# Patient Record
Sex: Female | Born: 2000 | Race: White | Hispanic: No | Marital: Single | Smoking: Never smoker
Health system: Southern US, Community
[De-identification: ages and names within clinical notes are randomized; demographics above are authoritative.]

---
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
## 2020-01-11 ENCOUNTER — Telehealth: Payer: Self-pay | Admitting: *Deleted

## 2020-01-11 NOTE — Telephone Encounter (Signed)
Patient's dad left a message requesting that her mom be called because she has tested positive for covid.  Called back and spoke to patient and her mom was on the phone also. Patient stated that she went to [facility] yesterday and was tested for covid. Patient stated that she got a message today that her test came back positive. Patient stated that she has a fever, cough, chills and body aches. Offered patient a virtual visit tomorrow with Nicki Reaper NP which she declined. Patient's mom stated that they are treating her symptoms with OTC medications. Patient stated that she will see how she does the next couple of days and if she does not improved she will call back for a virtual visit. ER precautions were given to patient and her mom and they verbalized understanding.

## 2020-01-12 NOTE — Telephone Encounter (Signed)
Agree with advise given.

## 2020-02-01 ENCOUNTER — Ambulatory Visit: Payer: BC Managed Care – PPO | Admitting: Internal Medicine

## 2020-03-29 ENCOUNTER — Ambulatory Visit: Payer: BC Managed Care – PPO | Admitting: Family Medicine

## 2020-03-29 ENCOUNTER — Other Ambulatory Visit: Payer: Self-pay

## 2020-03-29 ENCOUNTER — Encounter: Payer: Self-pay | Admitting: Family Medicine

## 2020-03-29 VITALS — BP 106/68 | HR 107 | Temp 97.8°F | Ht 65.0 in | Wt 116.0 lb

## 2020-03-29 DIAGNOSIS — M79602 Pain in left arm: Secondary | ICD-10-CM | POA: Diagnosis not present

## 2020-03-29 DIAGNOSIS — R946 Abnormal results of thyroid function studies: Secondary | ICD-10-CM | POA: Insufficient documentation

## 2020-03-29 NOTE — Patient Instructions (Signed)
Labs today We will watch these symptoms.  May try tylenol as needed.  Continue multivitamin.

## 2020-03-29 NOTE — Assessment & Plan Note (Addendum)
Update TSH, fT4 Patient largely asxs

## 2020-03-29 NOTE — Progress Notes (Signed)
This visit was conducted in person.  BP 106/68 (BP Location: Left Arm, Patient Position: Sitting, Cuff Size: Normal)   Pulse (!) 107   Temp 97.8 F (36.6 C) (Temporal)   Ht 5\' 5"  (1.651 m)   Wt 116 lb (52.6 kg)   LMP 03/03/2020   SpO2 99%   BMI 19.30 kg/m    CC: L arm pain Subjective:    Patient ID: Rhonda Freeman, female    DOB: Aug 01, 2001, 19 y.o.   MRN: [id number]  HPI: Rhonda Freeman is a 19 y.o. female presenting on 03/29/2020 for Arm Pain (C/o of left arm pain at shot site.  Received J&J COVID shot 03/17/20. )   Received J&J vaccine 03/17/2020 into L arm. Tolerated vaccine well - some body aches and HA day after shot but resolved. Did well the first 2 days.  Since yesterday noticing waves of shooting pain starting from L shoulder down to wrist - described as shooting pain rake going down entire arm (front, back, sides) - this can last from 10 seconds to 30 seconds. Intense discomfort. No aggravating or alleviating factors. Episodes can happen 5-10 times.   No heavy lifting, new exercise routine.  No headaches, fevers/chills, fatigue, back ache, vision changes, congestion, chest pain, dyspnea, abd pain. No neck pain. No paresthesias or numbness or weakness of either arm. No new rash.  Not on medications at this time.      Relevant past medical, surgical, family and social history reviewed and updated as indicated. Interim medical history since our last visit reviewed. Allergies and medications reviewed and updated. Outpatient Medications Prior to Visit  Medication Sig Dispense Refill  . polyethylene glycol (MIRALAX / GLYCOLAX) packet Take 17 g by mouth daily as needed for mild constipation.     No facility-administered medications prior to visit.     Per HPI unless specifically indicated in ROS section below Review of Systems Objective:    BP 106/68 (BP Location: Left Arm, Patient Position: Sitting, Cuff Size: Normal)   Pulse (!) 107   Temp 97.8 F (36.6 C)  (Temporal)   Ht 5\' 5"  (1.651 m)   Wt 116 lb (52.6 kg)   LMP 03/03/2020   SpO2 99%   BMI 19.30 kg/m   Wt Readings from Last 3 Encounters:  03/29/20 116 lb (52.6 kg) (29 %, Z= -0.54)*  01/15/19 117 lb (53.1 kg) (37 %, Z= -0.33)*  01/07/19 118 lb 4 oz (53.6 kg) (40 %, Z= -0.25)*   * Growth percentiles are based on CDC (Girls, 2-20 Years) data.    Physical Exam Vitals and nursing note reviewed.  Constitutional:      Appearance: Normal appearance. She is not ill-appearing.  HENT:     Head: Normocephalic and atraumatic.  Eyes:     General:        Right eye: No discharge.        Left eye: No discharge.     Extraocular Movements: Extraocular movements intact.     Conjunctiva/sclera: Conjunctivae normal.     Pupils: Pupils are equal, round, and reactive to light.  Musculoskeletal:        General: No swelling, tenderness or signs of injury. Normal range of motion.     Cervical back: Normal range of motion and neck supple. No rigidity.     Right lower leg: No edema.     Left lower leg: No edema.     Comments:  FROM at cervical neck without midline or paracervical mm tenderoness  No palpable cords.  Bilateral shoulder exam: No deformity of shoulders on inspection. No pain with palpation of shoulder landmarks. FROM in abduction and forward flexion. No pain or weakness with testing SITS in ext/int rotation. No pain with empty can sign. Neg Yerguson, Speed test. No impingement. No pain with crossover test. No pain with rotation of humeral head in GH joint.   Lymphadenopathy:     Cervical: No cervical adenopathy.  Skin:    General: Skin is warm and dry.     Capillary Refill: Capillary refill takes less than 2 seconds.     Findings: Bruising (small bruise medial mid upper L arm) present. No erythema or rash.  Neurological:     General: No focal deficit present.     Mental Status: She is alert. Mental status is at baseline.     Sensory: Sensation is intact.     Motor: Motor  function is intact.     Coordination: Coordination is intact.     Comments:  CN grossly intact 5/5 strength BUE Grip strength intact BUE sensation intact to light touch  Psychiatric:        Mood and Affect: Mood normal.        Behavior: Behavior normal.       Results for orders placed or performed in visit on 11/11/18  Calprotectin, Fecal   Specimen: Stool   STOOL  Result Value Ref Range   Calprotectin, Fecal 24 0 - 120 ug/g   Assessment & Plan:  This visit occurred during the SARS-CoV-2 public health emergency.  Safety protocols were in place, including screening questions prior to the visit, additional usage of staff PPE, and extensive cleaning of exam room while observing appropriate contact time as indicated for disinfecting solutions.   Problem List Items Addressed This Visit    Left arm pain - Primary    Overall benign exam. Unclear cause of symptoms.  Not consistent with DVT - unable to add D-dimer to blood drawn. If not improving, low threshold to check LUE venous US.  Check CBC to eval for thrombocytopenia today.       Relevant Orders   TSH   T4, free   CBC with Differential/Platelet   Vitamin B12   Abnormal thyroid function test    Update TSH, fT4 Patient largely asxs         No orders of the defined types were placed in this encounter.  Orders Placed This Encounter  Procedures  . TSH  . T4, free  . CBC with Differential/Platelet  . Vitamin B12    Patient Instructions  Labs today We will watch these symptoms.  May try tylenol as needed.  Continue multivitamin.    Follow up plan: No follow-ups on file.  Eustaquio Boyden, MD

## 2020-03-29 NOTE — Assessment & Plan Note (Addendum)
Overall benign exam. Unclear cause of symptoms.  Not consistent with DVT - unable to add D-dimer to blood drawn. If not improving, low threshold to check LUE venous US.  Check CBC to eval for thrombocytopenia today.

## 2020-03-30 ENCOUNTER — Telehealth: Payer: Self-pay | Admitting: Internal Medicine

## 2020-03-30 LAB — CBC WITH DIFFERENTIAL/PLATELET
Basophils Absolute: 0.1 10*3/uL (ref 0.0–0.1)
Basophils Relative: 1.2 % (ref 0.0–3.0)
Eosinophils Absolute: 0.2 10*3/uL (ref 0.0–0.7)
Eosinophils Relative: 1.8 % (ref 0.0–5.0)
HCT: 36.9 % (ref 36.0–49.0)
Hemoglobin: 12.2 g/dL (ref 12.0–16.0)
Lymphocytes Relative: 35.7 % (ref 24.0–48.0)
Lymphs Abs: 3.2 10*3/uL (ref 0.7–4.0)
MCHC: 33.1 g/dL (ref 31.0–37.0)
MCV: 86.4 fl (ref 78.0–98.0)
Monocytes Absolute: 0.8 10*3/uL (ref 0.1–1.0)
Monocytes Relative: 9 % (ref 3.0–12.0)
Neutro Abs: 4.7 10*3/uL (ref 1.4–7.7)
Neutrophils Relative %: 52.3 % (ref 43.0–71.0)
Platelets: 378 10*3/uL (ref 150.0–575.0)
RBC: 4.27 Mil/uL (ref 3.80–5.70)
RDW: 13.3 % (ref 11.4–15.5)
WBC: 9.1 10*3/uL (ref 4.5–13.5)

## 2020-03-30 LAB — T4, FREE: Free T4: 0.85 ng/dL (ref 0.60–1.60)

## 2020-03-30 LAB — VITAMIN B12: Vitamin B-12: 254 pg/mL (ref 211–911)

## 2020-03-30 LAB — TSH: TSH: 0.88 u[IU]/mL (ref 0.40–5.00)

## 2020-03-30 NOTE — Telephone Encounter (Signed)
Patients mother called today The patient was in yesterday for severe arm pain. She had her johnson and johnson vaccine done 10 days ago and her arm is not improving.   Mom is very concerned. She stated that the patient had blood work done yesterday but wanted to know if they need to see some type of specialist. Patient is still in a lot of pain and they are very concerned with blood clots. Mom wanted to know what they should do next.  Mom requested a call back as soon as possible to give advice   Can you advise on this since you seen her yesterday?

## 2020-03-30 NOTE — Telephone Encounter (Signed)
Labs still pending.  Called Rhonda Freeman's # on file, straight to voicemail. Will try again later. No DPR on file.

## 2020-03-30 NOTE — Telephone Encounter (Signed)
Spoke with mom. Sierria in class currently but did say symptoms were some better today.  Advised labs still pending.  Advised to let me know if ongoing or worsening symptoms to proceed with LUE venous ultrasound.

## 2020-12-21 ENCOUNTER — Telehealth: Payer: Self-pay | Admitting: Internal Medicine

## 2020-12-21 NOTE — Telephone Encounter (Signed)
Patient's mother,Mary,called.  Patient needs her immunization records for College. Please mail records to patient.

## 2021-01-04 NOTE — Telephone Encounter (Signed)
It was mailed

## 2021-01-06 NOTE — Telephone Encounter (Signed)
Was this mailed on 01/04/21 so has not had time to receive yet?

## 2021-01-06 NOTE — Telephone Encounter (Signed)
[facility] Night - Client Nonclinical Telephone Record AccessNurse Client [facility] Night - Client Client Site [facility] - Night Physician Nicki Reaper - NP Contact Type Call Who Is Calling Patient / Member / Family / Caregiver Caller Name Jenelle Mages Phone Number [phone] Patient Name Rhonda Freeman Patient DOB 2001-07-05 Call Type Message Only Information Provided Reason for Call Request for General Office Information Initial Comment Caller states she called 2 weeks ago to have her dtr's immunization records mailed but have not received them. She needs them for school. Disp. Time Disposition Final User 01/05/2021 5:25:46 PM General Information Provided Yes Ardelle Anton Call Closed By: Ardelle Anton Transaction Date/Time: 01/05/2021 5:23:48 PM (ET)

## 2021-05-01 ENCOUNTER — Ambulatory Visit: Payer: Self-pay

## 2021-05-01 NOTE — Telephone Encounter (Signed)
Patient's mom called and says the patient tested positive for COVID using a home test on yesterday. She says the symptoms started on Friday with sneezing, cough on Saturday. She says the patient reports coughing up brownish-yellow mucus. She has a fever 99.9 earlier and now 99.5, taking Tylenol for fever. She says the patient has sinus congestion and wheezing. I spoke to the patient about SOB, she denies. She reports body aches, sore throat, fatigue. Virtual appointment scheduled for Wednesday, 05/03/21 at 1020 with Nicki Reaper, NP, care advice given, patient and mom verbalized understanding. Advised e-visit or UC if wants to be evaluated sooner than Wednesday.  Reason for Disposition . [1] Continuous (nonstop) coughing interferes with work or school AND [2] no improvement using cough treatment per Care Advice  Answer Assessment - Initial Assessment Questions 1. COVID-19 DIAGNOSIS: "Who made your COVID-19 diagnosis?" "Was it confirmed by a positive lab test or self-test?" If not diagnosed by a doctor (or NP/PA), ask "Are there lots of cases (community spread) where you live?" Note: See public health department website, if unsure.     Home test on yesterday 2. COVID-19 EXPOSURE: "Was there any known exposure to COVID before the symptoms began?" CDC Definition of close contact: within 6 feet (2 meters) for a total of 15 minutes or more over a 24-hour period.     No 3. ONSET: "When did the COVID-19 symptoms start?"      Friday with sneezing, low grade fever on Saturday with coughing 4. WORST SYMPTOM: "What is your worst symptom?" (e.g., cough, fever, shortness of breath, muscle aches)     Cough and wheezing, sinus congestion 5. COUGH: "Do you have a cough?" If Yes, ask: "How bad is the cough?"       Yes, yellow brownish, coughing a lot today and during the night 6. FEVER: "Do you have a fever?" If Yes, ask: "What is your temperature, how was it measured, and when did it start?"     Low grade 99.9  earlier today and 99.5 now 7. RESPIRATORY STATUS: "Describe your breathing?" (e.g., shortness of breath, wheezing, unable to speak)      Wheezing, can't take a deep breath without coughing 8. BETTER-SAME-WORSE: "Are you getting better, staying the same or getting worse compared to yesterday?"  If getting worse, ask, "In what way?"     Same 9. HIGH RISK DISEASE: "Do you have any chronic medical problems?" (e.g., asthma, heart or lung disease, weak immune system, obesity, etc.)    No 10. VACCINE: "Have you had the COVID-19 vaccine?" If Yes, ask: "Which one, how many shots, when did you get it?"       J&J, Pfizer booster 11. BOOSTER: "Have you received your COVID-19 booster?" If Yes, ask: "Which one and when did you get it?"       Yes, one Pfizer booster 12. PREGNANCY: "Is there any chance you are pregnant?" "When was your last menstrual period?"       No, LMP 5 days ago 13. OTHER SYMPTOMS: "Do you have any other symptoms?"  (e.g., chills, fatigue, headache, loss of smell or taste, muscle pain, sore throat)       Sinus congestion, fatigue, sore throat, body aches 14. O2 SATURATION MONITOR:  "Do you use an oxygen saturation monitor (pulse oximeter) at home?" If Yes, ask "What is your reading (oxygen level) today?" "What is your usual oxygen saturation reading?" (e.g., 95%)       No  Protocols used: CORONAVIRUS (COVID-19) DIAGNOSED OR  SUSPECTED-A-AH

## 2021-05-03 ENCOUNTER — Ambulatory Visit (INDEPENDENT_AMBULATORY_CARE_PROVIDER_SITE_OTHER): Payer: BC Managed Care – PPO | Admitting: Internal Medicine

## 2021-05-03 ENCOUNTER — Encounter: Payer: Self-pay | Admitting: Internal Medicine

## 2021-05-03 ENCOUNTER — Other Ambulatory Visit: Payer: Self-pay

## 2021-05-03 VITALS — BP 99/60 | HR 72 | Temp 97.9°F

## 2021-05-03 DIAGNOSIS — U071 COVID-19: Secondary | ICD-10-CM

## 2021-05-03 MED ORDER — NIRMATRELVIR/RITONAVIR (PAXLOVID)TABLET: ORAL_TABLET | ORAL | 0 refills | Status: DC

## 2021-05-03 MED ORDER — BENZONATATE 100 MG PO CAPS: 100.0000 mg | ORAL_CAPSULE | Freq: Three times a day (TID) | ORAL | 0 refills | Status: DC | PRN

## 2021-05-03 NOTE — Progress Notes (Signed)
Pt cancelled this appt and rescheduled

## 2021-05-03 NOTE — Progress Notes (Signed)
Virtual Visit via Telephone Note  I connected with Rhonda Freeman on 05/03/21 at  9:20 AM EDT by telephone and verified that I am speaking with the correct person using two identifiers.  Location: Patient: Home Provider: Office  Person's participating in this video call: Nicki Reaper, NP, Rhonda Freeman and Megan Salon (mother)   I discussed the limitations, risks, security and privacy concerns of performing an evaluation and management service by telephone and the availability of in person appointments. I also discussed with the patient that there may be a patient responsible charge related to this service. The patient expressed understanding and agreed to proceed.   History of Present Illness:  Pt reports runny nose, nasal congestion, sneezing, sore throat, cough and chest congestion. This started 4 days ago. She is not blowing anything out of her nose. She denies difficulty swallowing. The cough is productive of yellow/brown mucous. She has had some wheezing but denies shortness of breath. She thinks she may have had a fever on Sunday, has body aches but denies chills. She denies headache, ear pain, nausea, vomiting or diarrhea. She has tried Tylenol OTC with minimal relief of symptoms. She did test positive for Covid on Sunday. She reports positive Covid contacts at a concert. She has been vaccinated.  History reviewed. No pertinent past medical history.  Current Outpatient Medications  Medication Sig Dispense Refill  . acetaminophen (TYLENOL) 500 MG tablet Take 1,000 mg by mouth daily.    . polyethylene glycol (MIRALAX / GLYCOLAX) packet Take 17 g by mouth daily as needed for mild constipation.     No current facility-administered medications for this visit.    No Known Allergies  Family History  Problem Relation Age of Onset  . Asthma Mother   . Hypertension Father   . Asthma Father   . Heart disease Maternal Grandmother   . Hyperlipidemia Maternal Grandmother   . Liver  cancer Maternal Grandfather   . Depression Maternal Grandfather   . Heart disease Maternal Grandfather   . Hyperlipidemia Maternal Grandfather   . Breast cancer Paternal Grandmother   . Depression Paternal Grandmother   . Heart disease Paternal Grandfather   . Hyperlipidemia Paternal Grandfather   . Asthma Paternal Grandfather     Social History   Socioeconomic History  . Marital status: Single    Spouse name: Not on file  . Number of children: Not on file  . Years of education: Not on file  . Highest education level: Not on file  Occupational History  . Not on file  Tobacco Use  . Smoking status: Never Smoker  . Smokeless tobacco: Never Used  Vaping Use  . Vaping Use: Every day  . Substances: Nicotine, Flavoring  Substance and Sexual Activity  . Alcohol use: No  . Drug use: No  . Sexual activity: Not on file  Other Topics Concern  . Not on file  Social History Narrative  . Not on file   Social Determinants of Health   Financial Resource Strain: Not on file  Food Insecurity: Not on file  Transportation Needs: Not on file  Physical Activity: Not on file  Stress: Not on file  Social Connections: Not on file  Intimate Partner Violence: Not on file     Constitutional: Pt reports fever and body aches. Denies malaise, fatigue, headache or abrupt weight changes.  HEENT: Pt reports runny nose, nasal congestion and sore throat. Denies eye pain, eye redness, ear pain, ringing in the ears, wax buildup, bloody nose  Respiratory: Pt reports cough and wheezing. Denies difficulty breathing, shortness of breath, cough or sputum production.   Cardiovascular: Denies chest pain, chest tightness, palpitations or swelling in the hands or feet.  Gastrointestinal: Denies abdominal pain, bloating, constipation, diarrhea or blood in the stool.   No other specific complaints in a complete review of systems (except as listed in HPI above).    Observations/Objective:  BP 99/60   Pulse  72   Temp 97.9 F (36.6 C) (Oral)  Wt Readings from Last 3 Encounters:  03/29/20 116 lb (52.6 kg) (29 %, Z= -0.54)*  01/15/19 117 lb (53.1 kg) (37 %, Z= -0.33)*  01/07/19 118 lb 4 oz (53.6 kg) (40 %, Z= -0.25)*   * Growth percentiles are based on CDC (Girls, 2-20 Years) data.    General: In NAD. HEENT:  Nose: sounds congested; Throat/Mouth: hoarseness noted.  Pulmonary/Chest: Normal effort. No respiratory distress.  Neurological: Alert and oriented.    BMET    Component Value Date/Time   NA 138 10/31/2018 1531   K 4.6 10/31/2018 1531   CL 104 10/31/2018 1531   CO2 24 10/31/2018 1531   GLUCOSE 84 10/31/2018 1531   BUN 14 10/31/2018 1531   CREATININE 0.69 10/31/2018 1531   CALCIUM 9.8 10/31/2018 1531   GFRNONAA NOT CALCULATED 08/09/2018 0408   GFRAA NOT CALCULATED 08/09/2018 0408    Lipid Panel     Component Value Date/Time   CHOL 189 09/05/2017 1111   TRIG 111.0 09/05/2017 1111   HDL 54.80 09/05/2017 1111   CHOLHDL 3 09/05/2017 1111   VLDL 22.2 09/05/2017 1111   LDLCALC 112 (H) 09/05/2017 1111    CBC    Component Value Date/Time   WBC 9.1 03/29/2020 1621   RBC 4.27 03/29/2020 1621   HGB 12.2 03/29/2020 1621   HCT 36.9 03/29/2020 1621   PLT 378.0 03/29/2020 1621   MCV 86.4 03/29/2020 1621   MCH 27.9 10/31/2018 1531   MCHC 33.1 03/29/2020 1621   RDW 13.3 03/29/2020 1621   LYMPHSABS 3.2 03/29/2020 1621   MONOABS 0.8 03/29/2020 1621   EOSABS 0.2 03/29/2020 1621   BASOSABS 0.1 03/29/2020 1621    Hgb A1C No results found for: HGBA1C     Assessment and Plan:  Covid 19:  Encouraged rest and fluids Discussed symptomatic care: Flonase and Zyrtec for runny nose, nasal congestion and sore throat RX for Paxlovid sent to pharmacy, kidney function reviewed RX for Tessalon 100 mg TID for cough Encouraged masking, social distancing, frequent handwashing and self quarantine x 5 days   Return precautions discussed  Follow Up Instructions:    I discussed  the assessment and treatment plan with the patient. The patient was provided an opportunity to ask questions and all were answered. The patient agreed with the plan and demonstrated an understanding of the instructions.   The patient was advised to call back or seek an in-person evaluation if the symptoms worsen or if the condition fails to improve as anticipated.  I provided 11:23 minutes of non-face-to-face time during this encounter.   Nicki Reaper, NP

## 2021-05-03 NOTE — Patient Instructions (Signed)

## 2022-07-05 ENCOUNTER — Encounter: Payer: Self-pay | Admitting: Internal Medicine

## 2022-09-24 ENCOUNTER — Encounter: Payer: Self-pay | Admitting: Internal Medicine

## 2022-09-24 ENCOUNTER — Ambulatory Visit (INDEPENDENT_AMBULATORY_CARE_PROVIDER_SITE_OTHER): Payer: BC Managed Care – PPO | Admitting: Internal Medicine

## 2022-09-24 VITALS — BP 112/76 | HR 72 | Temp 96.8°F | Ht 65.0 in | Wt 137.0 lb

## 2022-09-24 DIAGNOSIS — Z0001 Encounter for general adult medical examination with abnormal findings: Secondary | ICD-10-CM

## 2022-09-24 DIAGNOSIS — Z23 Encounter for immunization: Secondary | ICD-10-CM | POA: Diagnosis not present

## 2022-09-24 DIAGNOSIS — R5383 Other fatigue: Secondary | ICD-10-CM

## 2022-09-24 DIAGNOSIS — N926 Irregular menstruation, unspecified: Secondary | ICD-10-CM

## 2022-09-24 DIAGNOSIS — Z1159 Encounter for screening for other viral diseases: Secondary | ICD-10-CM | POA: Diagnosis not present

## 2022-09-24 LAB — CBC: RDW: 13.7 % (ref 11.0–15.0)

## 2022-09-24 NOTE — Patient Instructions (Signed)

## 2022-09-24 NOTE — Progress Notes (Signed)
Subjective:    Patient ID: Rhonda Freeman, female    DOB: 07-05-2001, 21 y.o.   MRN: [id number]  HPI  Patient presents to clinic today for her annual exam.  Flu: never Tetanus: 06/2012 COVID: [place] Dibbles x1 Pap smear: Never Dentist: biannually  Diet: She does eat meat. She consumes fruits and veggies. She does eat some fried foods. She drinks mostly water. Exercise: Weight lifting  Review of Systems     No past medical history on file.  Current Outpatient Medications  Medication Sig Dispense Refill   acetaminophen (TYLENOL) 500 MG tablet Take 1,000 mg by mouth daily.     benzonatate (TESSALON) 100 MG capsule Take 1 capsule (100 mg total) by mouth 3 (three) times daily as needed for cough. 30 capsule 0   nirmatrelvir/ritonavir EUA (PAXLOVID) TABS GFR > 60 nirmatrelvir (150 mg) 2 tabs PO BID for 5 days and ritonavir (100 mg) 1 tab PO BID for 5 days. 30 tablet 0   polyethylene glycol (MIRALAX / GLYCOLAX) packet Take 17 g by mouth daily as needed for mild constipation.     No current facility-administered medications for this visit.    No Known Allergies  Family History  Problem Relation Age of Onset   Asthma Mother    Hypertension Father    Asthma Father    Heart disease Maternal Grandmother    Hyperlipidemia Maternal Grandmother    Liver cancer Maternal Grandfather    Depression Maternal Grandfather    Heart disease Maternal Grandfather    Hyperlipidemia Maternal Grandfather    Breast cancer Paternal Grandmother    Depression Paternal Grandmother    Heart disease Paternal Grandfather    Hyperlipidemia Paternal Grandfather    Asthma Paternal Grandfather     Social History   Socioeconomic History   Marital status: Single    Spouse name: Not on file   Number of children: Not on file   Years of education: Not on file   Highest education level: Not on file  Occupational History   Not on file  Tobacco Use   Smoking status: Never   Smokeless tobacco: Never   Vaping Use   Vaping Use: Every day   Substances: Nicotine, Flavoring  Substance and Sexual Activity   Alcohol use: No   Drug use: No   Sexual activity: Not on file  Other Topics Concern   Not on file  Social History Narrative   Not on file   Social Determinants of Health   Financial Resource Strain: Not on file  Food Insecurity: Not on file  Transportation Needs: Not on file  Physical Activity: Not on file  Stress: Not on file  Social Connections: Not on file  Intimate Partner Violence: Not on file     Constitutional: Pt reports fatigue. Denies fever, malaise, headache or abrupt weight changes.  HEENT: Denies eye pain, eye redness, ear pain, ringing in the ears, wax buildup, runny nose, nasal congestion, bloody nose, or sore throat. Respiratory: Denies difficulty breathing, shortness of breath, cough or sputum production.   Cardiovascular: Denies chest pain, chest tightness, palpitations or swelling in the hands or feet.  Gastrointestinal: Denies abdominal pain, bloating, constipation, diarrhea or blood in the stool.  GU: Patient reports irregular menses.  Denies urgency, frequency, pain with urination, burning sensation, blood in urine, odor or discharge. Musculoskeletal: Denies decrease in range of motion, difficulty with gait, muscle pain or joint pain and swelling.  Skin: Denies redness, rashes, lesions or ulcercations.  Neurological: Denies dizziness,  difficulty with memory, difficulty with speech or problems with balance and coordination.  Psych: Denies anxiety, depression, SI/HI.  No other specific complaints in a complete review of systems (except as listed in HPI above).  Objective:   Physical Exam BP 112/76 (BP Location: Right Arm, Patient Position: Sitting, Cuff Size: Normal)   Pulse 72   Temp (!) 96.8 F (36 C) (Temporal)   Ht $R'5\' 5"'gI$  (1.651 m)   Wt 137 lb (62.1 kg)   SpO2 95%   BMI 22.80 kg/m   Wt Readings from Last 3 Encounters:  03/29/20 116 lb (52.6  kg) (29 %, Z= -0.54)*  01/15/19 117 lb (53.1 kg) (37 %, Z= -0.33)*  01/07/19 118 lb 4 oz (53.6 kg) (40 %, Z= -0.25)*   * Growth percentiles are based on CDC (Girls, 2-20 Years) data.    General: Appears her stated age, well developed, well nourished in NAD. Skin: Warm, dry and intact.  HEENT: Head: normal shape and size; Eyes: sclera white, no icterus, conjunctiva pink, PERRLA and EOMs intact;  Neck:  Neck supple, trachea midline. No masses, lumps or thyromegaly present.  Cardiovascular: Normal rate and rhythm. S1,S2 noted.  No murmur, rubs or gallops noted. No JVD or BLE edema.  Pulmonary/Chest: Normal effort and positive vesicular breath sounds. No respiratory distress. No wheezes, rales or ronchi noted.  Abdomen: Soft and nontender. Normal bowel sounds.  Musculoskeletal: Strength 5/5 BUE/BLE. No difficulty with gait.  Neurological: Alert and oriented. Cranial nerves II-XII grossly intact. Coordination normal.  Psychiatric: Mood and affect normal. Behavior is normal. Judgment and thought content normal.    BMET    Component Value Date/Time   NA 138 10/31/2018 1531   K 4.6 10/31/2018 1531   CL 104 10/31/2018 1531   CO2 24 10/31/2018 1531   GLUCOSE 84 10/31/2018 1531   BUN 14 10/31/2018 1531   CREATININE 0.69 10/31/2018 1531   CALCIUM 9.8 10/31/2018 1531   GFRNONAA NOT CALCULATED 08/09/2018 0408   GFRAA NOT CALCULATED 08/09/2018 0408    Lipid Panel     Component Value Date/Time   CHOL 189 09/05/2017 1111   TRIG 111.0 09/05/2017 1111   HDL 54.80 09/05/2017 1111   CHOLHDL 3 09/05/2017 1111   VLDL 22.2 09/05/2017 1111   LDLCALC 112 (H) 09/05/2017 1111    CBC    Component Value Date/Time   WBC 9.1 03/29/2020 1621   RBC 4.27 03/29/2020 1621   HGB 12.2 03/29/2020 1621   HCT 36.9 03/29/2020 1621   PLT 378.0 03/29/2020 1621   MCV 86.4 03/29/2020 1621   MCH 27.9 10/31/2018 1531   MCHC 33.1 03/29/2020 1621   RDW 13.3 03/29/2020 1621   LYMPHSABS 3.2 03/29/2020 1621    MONOABS 0.8 03/29/2020 1621   EOSABS 0.2 03/29/2020 1621   BASOSABS 0.1 03/29/2020 1621    Hgb A1C No results found for: "HGBA1C"          Assessment & Plan:   Preventative Health Maintenance:  She declines flu shot Tdap today Encouraged her to get her COVID booster Pap smear scheduled with GYN Encouraged her to consume a balanced diet and exercise regimen Advised her to see an eye doctor and dentist annually We will check CBC, c-Met, lipid profile and hep C today  Irregular Menses, Fatigue:  We will check vitamin B 12, vitamin D and TSH  RTC in 1 year, sooner if needed Webb Silversmith, NP

## 2022-09-25 LAB — CBC
HCT: 32.5 % — ABNORMAL LOW (ref 35.0–45.0)
Hemoglobin: 10.6 g/dL — ABNORMAL LOW (ref 11.7–15.5)
MCH: 25.9 pg — ABNORMAL LOW (ref 27.0–33.0)
MCHC: 32.6 g/dL (ref 32.0–36.0)
MCV: 79.3 fL — ABNORMAL LOW (ref 80.0–100.0)
MPV: 10.3 fL (ref 7.5–12.5)
Platelets: 372 10*3/uL (ref 140–400)
RBC: 4.1 10*6/uL (ref 3.80–5.10)
WBC: 6.5 10*3/uL (ref 3.8–10.8)

## 2022-09-25 LAB — COMPLETE METABOLIC PANEL WITH GFR
AG Ratio: 1.6 (calc) (ref 1.0–2.5)
ALT: 9 U/L (ref 6–29)
AST: 15 U/L (ref 10–30)
Albumin: 4.6 g/dL (ref 3.6–5.1)
Alkaline phosphatase (APISO): 72 U/L (ref 31–125)
BUN: 13 mg/dL (ref 7–25)
CO2: 25 mmol/L (ref 20–32)
Calcium: 9.5 mg/dL (ref 8.6–10.2)
Chloride: 105 mmol/L (ref 98–110)
Creat: 0.79 mg/dL (ref 0.50–0.96)
Globulin: 2.8 g/dL (calc) (ref 1.9–3.7)
Glucose, Bld: 75 mg/dL (ref 65–99)
Potassium: 4.1 mmol/L (ref 3.5–5.3)
Sodium: 139 mmol/L (ref 135–146)
Total Bilirubin: 0.6 mg/dL (ref 0.2–1.2)
Total Protein: 7.4 g/dL (ref 6.1–8.1)
eGFR: 109 mL/min/{1.73_m2} (ref >=60)

## 2022-09-25 LAB — LIPID PANEL
Cholesterol: 225 mg/dL — ABNORMAL HIGH (ref <200)
HDL: 63 mg/dL (ref >=50)
LDL Cholesterol (Calc): 138 mg/dL (calc) — ABNORMAL HIGH
Non-HDL Cholesterol (Calc): 162 mg/dL (calc) — ABNORMAL HIGH (ref <130)
Total CHOL/HDL Ratio: 3.6 (calc) (ref <5.0)
Triglycerides: 120 mg/dL (ref <150)

## 2022-09-25 LAB — TSH: TSH: 0.96 mIU/L

## 2022-09-25 LAB — VITAMIN D 25 HYDROXY (VIT D DEFICIENCY, FRACTURES): Vit D, 25-Hydroxy: 15 ng/mL — ABNORMAL LOW (ref 30–100)

## 2022-09-25 LAB — HEPATITIS C ANTIBODY: Hepatitis C Ab: NONREACTIVE

## 2022-09-25 LAB — VITAMIN B12: Vitamin B-12: 421 pg/mL (ref 200–1100)

## 2022-09-25 MED ORDER — VITAMIN D (ERGOCALCIFEROL) 1.25 MG (50000 UNIT) PO CAPS: 50000.0000 [IU] | ORAL_CAPSULE | ORAL | 0 refills | Status: DC

## 2022-09-25 NOTE — Addendum Note (Signed)
Addended by: Jearld Fenton on: 09/25/2022 09:49 AM   Modules accepted: Orders

## 2022-09-26 ENCOUNTER — Telehealth: Payer: Self-pay | Admitting: *Deleted

## 2022-09-26 NOTE — Telephone Encounter (Signed)
Pt called for lab results. Reviewed with pt, verbalizes understanding. Pt states she will discuss hormone therapy with her OB/GYN.  "She is slightly anemic.  This is likely due to her irregular menstrual cycles.  Would she be willing to start hormonal therapy to help regulate her menstrual cycles?  Cholesterol is elevated.  I recommend she consume a low saturated fat diet and increase aerobic exercise to avoid having to start cholesterol medication in the future.  Vitamin D levels are low.  I am sending in a prescription vitamin D supplement for her to take weekly for the next 12 weeks.  Liver and kidney function is normal.  B12 levels are normal.  Thyroid function is normal."

## 2022-12-10 ENCOUNTER — Other Ambulatory Visit: Payer: Self-pay | Admitting: Internal Medicine

## 2022-12-11 NOTE — Telephone Encounter (Signed)
Requested medication (s) are due for refill today: Yes  Requested medication (s) are on the active medication list: Yes  Last refill:  09/25/22  Future visit scheduled: No  Notes to clinic:  Unable to refill per protocol, cannot delegate.      Requested Prescriptions  Pending Prescriptions Disp Refills   Vitamin D, Ergocalciferol, (DRISDOL) 1.25 MG (50000 UNIT) CAPS capsule [Pharmacy Med Name: VITAMIN D2 1.25MG (50,000 UNIT)] 12 capsule 0    Sig: TAKE 1 CAPSULE BY MOUTH ONCE A WEEK. FOR 12 WEEKS. THEN START OTC VITAMIN D3 2,000 UNIT DAILY.     Endocrinology:  Vitamins - Vitamin D Supplementation 2 Failed - 12/10/2022  9:30 AM      Failed - Manual Review: Route requests for 50,000 IU strength to the provider      Failed - Vitamin D in normal range and within 360 days    Vit D, 25-Hydroxy  Date Value Ref Range Status  09/24/2022 15 (L) 30 - 100 ng/mL Final    Comment:    Vitamin D Status         25-OH Vitamin D: . Deficiency:                    <20 ng/mL Insufficiency:             20 - 29 ng/mL Optimal:                 > or = 30 ng/mL . For 25-OH Vitamin D testing on patients on  D2-supplementation and patients for whom quantitation  of D2 and D3 fractions is required, the [facility](TM) 25-OH VIT D, (D2,D3), LC/MS/MS is recommended: order  code [id number] (patients >32yrs). . See Note 1 . Note 1 . For additional information, please refer to  http://education.[facility].com/faq/FAQ199  (This link is being provided for informational/ educational purposes only.)          Passed - Ca in normal range and within 360 days    Calcium  Date Value Ref Range Status  09/24/2022 9.5 8.6 - 10.2 mg/dL Final         Passed - Valid encounter within last 12 months    Recent Outpatient Visits           2 months ago Need for Tdap vaccination   [facility] [place], Coralie Keens, NP   1 year ago [facility] [place], Coralie Keens, [place]

## 2023-03-20 ENCOUNTER — Ambulatory Visit: Payer: BC Managed Care – PPO | Admitting: Internal Medicine

## 2023-03-20 ENCOUNTER — Encounter: Payer: Self-pay | Admitting: Internal Medicine

## 2023-03-20 VITALS — BP 108/64 | HR 75 | Temp 96.6°F | Wt 139.0 lb

## 2023-03-20 DIAGNOSIS — E559 Vitamin D deficiency, unspecified: Secondary | ICD-10-CM | POA: Diagnosis not present

## 2023-03-20 DIAGNOSIS — D509 Iron deficiency anemia, unspecified: Secondary | ICD-10-CM | POA: Insufficient documentation

## 2023-03-20 DIAGNOSIS — D500 Iron deficiency anemia secondary to blood loss (chronic): Secondary | ICD-10-CM | POA: Diagnosis not present

## 2023-03-20 DIAGNOSIS — G4700 Insomnia, unspecified: Secondary | ICD-10-CM

## 2023-03-20 DIAGNOSIS — R5383 Other fatigue: Secondary | ICD-10-CM

## 2023-03-20 DIAGNOSIS — E7800 Pure hypercholesterolemia, unspecified: Secondary | ICD-10-CM | POA: Insufficient documentation

## 2023-03-20 LAB — HM PAP SMEAR

## 2023-03-20 NOTE — Patient Instructions (Signed)
Iron Deficiency Anemia, Adult  Iron deficiency anemia is a condition in which the concentration of red blood cells or hemoglobin in the blood is below normal because of too little iron. Hemoglobin is a substance in red blood cells that carries oxygen to the body's tissues. When the concentration of red blood cells or hemoglobin is too low, not enough oxygen reaches these tissues. Iron deficiency anemia is usually long-lasting, and it develops over time. It may or may not cause symptoms. It is a common type of anemia. What are the causes? This condition may be caused by: Not enough iron in the diet. Abnormal absorption in the gut. Blood loss. What increases the risk? You are more likely to develop this condition if you get menstrual periods (menstruate) or are pregnant. What are the signs or symptoms? Symptoms of this condition may include: Pale skin, lips, and nail beds. Weakness, dizziness, and getting tired easily. Shortness of breath when moving or exercising. Cold hands or feet. Mild anemia may not cause any symptoms. How is this diagnosed? This condition is diagnosed based on: Your medical history. A physical exam. Blood tests. How is this treated? This condition is treated by correcting the cause of your iron deficiency. Treatment may involve: Adding iron-rich foods to your diet. Taking iron supplements. If you are pregnant or breastfeeding, you may need to take extra iron because your normal diet usually does not provide the amount of iron that you need. Increasing vitamin C intake. Vitamin C helps your body absorb iron. Your health care provider may recommend that you take iron supplements along with a glass of orange juice or a vitamin C supplement. Medicines to make heavy menstrual flow lighter. Surgery or additional testing procedures to determine the cause of your anemia. You may need repeat blood tests to determine whether treatment is working. If the treatment does not  seem to be working, you may need more tests. Follow these instructions at home: Medicines Take over-the-counter and prescription medicines only as told by your health care provider. This includes iron supplements and vitamins. This is important because too much iron can be harmful. For the best iron absorption, you should take iron supplements when your stomach is empty. If you cannot tolerate them on an empty stomach, you may need to take them with food. Do not drink milk or take antacids at the same time as your iron supplements. Milk and antacids may interfere with how your body absorbs iron. Iron supplements may turn stool (feces) a darker color and it may appear black. If you cannot tolerate taking iron supplements by mouth, talk with your health care provider about taking them through an IV or through an injection into a muscle. Eating and drinking Talk with your health care provider before changing your diet. Your provider may recommend that you eat foods that contain a lot of iron, such as: Liver. Low-fat (lean) beef. Breads and cereals that have iron added to them (are fortified). Eggs. Dried fruit. Dark green, leafy vegetables. To help your body use the iron from iron-rich foods, eat those foods at the same time as fresh fruits and vegetables that are high in vitamin C. Foods that are high in vitamin C include: Oranges. Peppers. Tomatoes. Mangoes. Managing constipation If you are taking an iron supplement, it may cause constipation. To prevent or treat constipation, you may need to: Drink enough fluid to keep your urine pale yellow. Take over-the-counter or prescription medicines. Eat foods that are high in fiber, such   as beans, whole grains, and fresh fruits and vegetables. Limit foods that are high in fat and processed sugars, such as fried or sweet foods. General instructions Return to your normal activities as told by your health care provider. Ask your health care provider  what activities are safe for you. Keep all follow-up visits. Contact a health care provider if: You feel nauseous or you vomit. You feel weak. You become light-headed when getting up from a sitting or lying down position. You have unexplained sweating. You develop symptoms of constipation. You have a heaviness in your chest. You have trouble breathing with physical activity. Get help right away if: You faint. If this happens, do not drive yourself to the hospital. You have an irregular or rapid heartbeat. Summary Iron deficiency anemia is a condition in which the concentration of red blood cells or hemoglobin in the blood is below normal because of too little iron. This condition is treated by correcting the cause of your iron deficiency. Take over-the-counter and prescription medicines only as told by your health care provider. This includes iron supplements and vitamins. To help your body use the iron from iron-rich foods, eat those foods at the same time as fresh fruits and vegetables that are high in vitamin C. Seek medical help if you have signs or symptoms of worsening anemia. This information is not intended to replace advice given to you by your health care provider. Make sure you discuss any questions you have with your health care provider. Document Revised: 01/03/2022 Document Reviewed: 01/03/2022 Elsevier Patient Education  2023 Elsevier Inc.  

## 2023-03-20 NOTE — Progress Notes (Signed)
Subjective:    Patient ID: Rhonda Freeman, female    DOB: December 02, 2001, 22 y.o.   MRN: [id number]  HPI  Patient presents to clinic today with complaint of difficulty staying asleep. She noticed this about 2 months ago. She can fall asleep but wakes up multiple times a night, more so if she is dreaming. She is unsure if she snores. She does feel rested when she wakes up in the morning. She has not tried any sleep aides OTC.  She reports she does not have a routine schedule.  She is not currently going to school.  She works part-time.  She does exercise daily.  She was slightly anemic on her last check with an H&H of 10.6/32.5, 09/2022.  She has had a vitamin D deficiency in the past as well but is not currently taking any vitamin D OTC.  Review of Systems     No past medical history on file.  Current Outpatient Medications  Medication Sig Dispense Refill   Vitamin D, Ergocalciferol, (DRISDOL) 1.25 MG (50000 UNIT) CAPS capsule TAKE 1 CAPSULE BY MOUTH ONCE A WEEK. FOR 12 WEEKS. THEN START OTC VITAMIN D3 2,000 UNIT DAILY. 12 capsule 0   No current facility-administered medications for this visit.    No Known Allergies  Family History  Problem Relation Age of Onset   Asthma Mother    Hypertension Father    Asthma Father    Heart disease Maternal Grandmother    Hyperlipidemia Maternal Grandmother    Liver cancer Maternal Grandfather    Depression Maternal Grandfather    Heart disease Maternal Grandfather    Hyperlipidemia Maternal Grandfather    Breast cancer Paternal Grandmother    Depression Paternal Grandmother    Heart disease Paternal Grandfather    Hyperlipidemia Paternal Grandfather    Asthma Paternal Grandfather     Social History   Socioeconomic History   Marital status: Single    Spouse name: Not on file   Number of children: Not on file   Years of education: Not on file   Highest education level: Not on file  Occupational History   Not on file  Tobacco Use    Smoking status: Never   Smokeless tobacco: Never  Vaping Use   Vaping Use: Former   Substances: Nicotine, Flavoring  Substance and Sexual Activity   Alcohol use: No   Drug use: No   Sexual activity: Not on file  Other Topics Concern   Not on file  Social History Narrative   Not on file   Social Determinants of Health   Financial Resource Strain: Not on file  Food Insecurity: Not on file  Transportation Needs: Not on file  Physical Activity: Not on file  Stress: Not on file  Social Connections: Not on file  Intimate Partner Violence: Not on file     Constitutional: Patient reports fatigue.  Denies fever, malaise, headache or abrupt weight changes.  HEENT: Denies eye pain, eye redness, ear pain, ringing in the ears, wax buildup, runny nose, nasal congestion, bloody nose, or sore throat. Respiratory: Denies difficulty breathing, shortness of breath, cough or sputum production.   Cardiovascular: Denies chest pain, chest tightness, palpitations or swelling in the hands or feet.  Gastrointestinal: Denies abdominal pain, bloating, constipation, diarrhea or blood in the stool.  GU: Denies urgency, frequency, pain with urination, burning sensation, blood in urine, odor or discharge. Musculoskeletal: Denies decrease in range of motion, difficulty with gait, muscle pain or joint pain and swelling.  Skin: Denies redness, rashes, lesions or ulcercations.  Neurological: Patient reports insomnia.  Denies dizziness, difficulty with memory, difficulty with speech or problems with balance and coordination.  Psych: Denies anxiety, depression, SI/HI.  No other specific complaints in a complete review of systems (except as listed in HPI above).  Objective:   Physical Exam   BP 108/64 (BP Location: Left Arm, Patient Position: Sitting, Cuff Size: Normal)   Pulse 75   Temp (!) 96.6 F (35.9 C) (Temporal)   Wt 139 lb (63 kg)   SpO2 99%   BMI 23.13 kg/m   Wt Readings from Last 3 Encounters:   09/24/22 137 lb (62.1 kg)  03/29/20 116 lb (52.6 kg) (29 %, Z= -0.54)*  01/15/19 117 lb (53.1 kg) (37 %, Z= -0.33)*   * Growth percentiles are based on CDC (Girls, 2-20 Years) data.    General: Appears her stated age, well developed, well nourished in NAD. Skin: Warm, dry and intact.  HEENT: Head: normal shape and size; Eyes: sclera white, no icterus, conjunctiva pink, PERRLA and EOMs intact;  Cardiovascular: Normal rate. Pulmonary/Chest: Normal effort and positive vesicular breath sounds. No respiratory distress. No wheezes, rales or ronchi noted.  Musculoskeletal: No difficulty with gait.  Neurological: Alert and oriented.  Psychiatric: Mood and affect normal. Behavior is normal. Judgment and thought content normal.     BMET    Component Value Date/Time   NA 139 09/24/2022 1431   K 4.1 09/24/2022 1431   CL 105 09/24/2022 1431   CO2 25 09/24/2022 1431   GLUCOSE 75 09/24/2022 1431   BUN 13 09/24/2022 1431   CREATININE 0.79 09/24/2022 1431   CALCIUM 9.5 09/24/2022 1431   GFRNONAA NOT CALCULATED 08/09/2018 0408   GFRAA NOT CALCULATED 08/09/2018 0408    Lipid Panel     Component Value Date/Time   CHOL 225 (H) 09/24/2022 1431   TRIG 120 09/24/2022 1431   HDL 63 09/24/2022 1431   CHOLHDL 3.6 09/24/2022 1431   VLDL 22.2 09/05/2017 1111   LDLCALC 138 (H) 09/24/2022 1431    CBC    Component Value Date/Time   WBC 6.5 09/24/2022 1431   RBC 4.10 09/24/2022 1431   HGB 10.6 (L) 09/24/2022 1431   HCT 32.5 (L) 09/24/2022 1431   PLT 372 09/24/2022 1431   MCV 79.3 (L) 09/24/2022 1431   MCH 25.9 (L) 09/24/2022 1431   MCHC 32.6 09/24/2022 1431   RDW 13.7 09/24/2022 1431   LYMPHSABS 3.2 03/29/2020 1621   MONOABS 0.8 03/29/2020 1621   EOSABS 0.2 03/29/2020 1621   BASOSABS 0.1 03/29/2020 1621    Hgb A1C No results found for: "HGBA1C"         Assessment & Plan:   Fatigue, Insomnia, Iron Deficiency Anemia, Vit D Deficiency:  Will check CBC, iron panel and vitamin D  today Encouraged routine exercise regimen Start melatonin 5 mg but may take up to 10 mg at bedtime Consider sleep aid if symptoms persist or worsen  RTC in 6 months for your annual exam Nicki Reaper, NP

## 2023-03-21 ENCOUNTER — Encounter: Payer: Self-pay | Admitting: Internal Medicine

## 2023-03-21 LAB — CBC WITH DIFFERENTIAL/PLATELET
Absolute Monocytes: 816 {cells}/uL (ref 200–950)
Basophils Absolute: 72 {cells}/uL (ref 0–200)
Basophils Relative: 0.9 %
Eosinophils Absolute: 304 {cells}/uL (ref 15–500)
Eosinophils Relative: 3.8 %
HCT: 34.4 % — ABNORMAL LOW (ref 35.0–45.0)
Hemoglobin: 10.8 g/dL — ABNORMAL LOW (ref 11.7–15.5)
Lymphs Abs: 2968 {cells}/uL (ref 850–3900)
MCH: 25.8 pg — ABNORMAL LOW (ref 27.0–33.0)
MCHC: 31.4 g/dL — ABNORMAL LOW (ref 32.0–36.0)
MCV: 82.3 fL (ref 80.0–100.0)
MPV: 10.3 fL (ref 7.5–12.5)
Monocytes Relative: 10.2 %
Neutro Abs: 3840 {cells}/uL (ref 1500–7800)
Neutrophils Relative %: 48 %
Platelets: 378 Thousand/uL (ref 140–400)
RBC: 4.18 Million/uL (ref 3.80–5.10)
RDW: 13.9 % (ref 11.0–15.0)
Total Lymphocyte: 37.1 %
WBC: 8 Thousand/uL (ref 3.8–10.8)

## 2023-03-21 LAB — IRON,TIBC AND FERRITIN PANEL
%SAT: 8 % — ABNORMAL LOW (ref 16–45)
Ferritin: 3 ng/mL — ABNORMAL LOW (ref 16–154)
Iron: 34 ug/dL — ABNORMAL LOW (ref 40–190)
TIBC: 405 ug/dL (ref 250–450)

## 2023-03-21 LAB — VITAMIN D 25 HYDROXY (VIT D DEFICIENCY, FRACTURES): Vit D, 25-Hydroxy: 93 ng/mL (ref 30–100)
